# Patient Record
Sex: Male | Born: 1995 | Race: White | Hispanic: No | Marital: Single
Health system: Southern US, Community
[De-identification: ages and names within clinical notes are randomized; demographics above are authoritative.]

---
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
## 2018-12-27 ENCOUNTER — Other Ambulatory Visit: Payer: BC Managed Care – PPO

## 2018-12-27 ENCOUNTER — Telehealth: Payer: Self-pay

## 2018-12-27 DIAGNOSIS — Z20822 Contact with and (suspected) exposure to covid-19: Secondary | ICD-10-CM

## 2018-12-27 NOTE — Telephone Encounter (Signed)
Rec'd request from [facility] to schedule for COVID testing due to poss. Exposure.    PC to pt's mother.  Scheduled for COVID test today at 2:15 PM, at the [facility] test site, [employer].  Advised pt. Should wear mask, and remain in car for testing.  Verb. Understanding.

## 2019-01-03 LAB — NOVEL CORONAVIRUS, NAA: SARS-CoV-2, NAA: NOT DETECTED

## 2019-09-15 ENCOUNTER — Ambulatory Visit: Payer: BC Managed Care – PPO
